# Patient Record
Sex: Female | Born: 2000 | Race: White | Hispanic: No | Marital: Single | Smoking: Never smoker
Health system: Southern US, Community
[De-identification: ages and names within clinical notes are randomized; demographics above are authoritative.]

## PROBLEM LIST (undated history)

## (undated) DIAGNOSIS — Z8659 Personal history of other mental and behavioral disorders: Secondary | ICD-10-CM

## (undated) DIAGNOSIS — R569 Unspecified convulsions: Secondary | ICD-10-CM

## (undated) HISTORY — PX: TYMPANOSTOMY TUBE PLACEMENT: SHX32

## (undated) HISTORY — DX: Personal history of other mental and behavioral disorders: Z86.59

## (undated) HISTORY — PX: MYRINGOTOMY: SUR874

---
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
## 2020-04-21 ENCOUNTER — Encounter (INDEPENDENT_AMBULATORY_CARE_PROVIDER_SITE_OTHER): Payer: Self-pay | Admitting: Student in an Organized Health Care Education/Training Program
